# Patient Record
Sex: Female | Born: 2000 | Race: White | Hispanic: No | Marital: Single | Smoking: Never smoker
Health system: Southern US, Community
[De-identification: ages and names within clinical notes are randomized; demographics above are authoritative.]

## PROBLEM LIST (undated history)

## (undated) DIAGNOSIS — K5900 Constipation, unspecified: Secondary | ICD-10-CM

## (undated) DIAGNOSIS — L509 Urticaria, unspecified: Secondary | ICD-10-CM

## (undated) DIAGNOSIS — R109 Unspecified abdominal pain: Secondary | ICD-10-CM

## (undated) HISTORY — DX: Urticaria, unspecified: L50.9

## (undated) HISTORY — DX: Constipation, unspecified: K59.00

## (undated) HISTORY — PX: WISDOM TOOTH EXTRACTION: SHX21

## (undated) HISTORY — DX: Unspecified abdominal pain: R10.9

---
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
## 2024-06-04 ENCOUNTER — Ambulatory Visit: Payer: Self-pay
